# Patient Record
Sex: Female | Born: 1965 | Race: Black or African American | Hispanic: No | Marital: Single | Smoking: Never smoker
Health system: Southern US, Community
[De-identification: ages and names within clinical notes are randomized; demographics above are authoritative.]

## PROBLEM LIST (undated history)

## (undated) DIAGNOSIS — I10 Essential (primary) hypertension: Secondary | ICD-10-CM

## (undated) HISTORY — PX: LEG SURGERY: SHX1003

## (undated) HISTORY — PX: OTHER SURGICAL HISTORY: SHX169

---

## 1999-01-27 ENCOUNTER — Emergency Department (HOSPITAL_COMMUNITY): Admission: EM | Admit: 1999-01-27 | Discharge: 1999-01-27 | Payer: Self-pay | Admitting: Emergency Medicine

## 1999-01-27 ENCOUNTER — Encounter: Payer: Self-pay | Admitting: Emergency Medicine

## 2012-01-07 ENCOUNTER — Encounter (HOSPITAL_BASED_OUTPATIENT_CLINIC_OR_DEPARTMENT_OTHER): Payer: Self-pay

## 2012-01-07 ENCOUNTER — Telehealth: Payer: Self-pay | Admitting: Physician Assistant

## 2012-01-07 ENCOUNTER — Emergency Department (HOSPITAL_BASED_OUTPATIENT_CLINIC_OR_DEPARTMENT_OTHER): Payer: PRIVATE HEALTH INSURANCE

## 2012-01-07 ENCOUNTER — Emergency Department (HOSPITAL_BASED_OUTPATIENT_CLINIC_OR_DEPARTMENT_OTHER)
Admission: EM | Admit: 2012-01-07 | Discharge: 2012-01-07 | Disposition: A | Discharge status: Other Acute Inpatient Hospital | Payer: PRIVATE HEALTH INSURANCE | Attending: Emergency Medicine | Admitting: Emergency Medicine

## 2012-01-07 DIAGNOSIS — R209 Unspecified disturbances of skin sensation: Secondary | ICD-10-CM | POA: Insufficient documentation

## 2012-01-07 DIAGNOSIS — I169 Hypertensive crisis, unspecified: Secondary | ICD-10-CM

## 2012-01-07 DIAGNOSIS — Z79899 Other long term (current) drug therapy: Secondary | ICD-10-CM | POA: Insufficient documentation

## 2012-01-07 DIAGNOSIS — I619 Nontraumatic intracerebral hemorrhage, unspecified: Secondary | ICD-10-CM | POA: Insufficient documentation

## 2012-01-07 DIAGNOSIS — R51 Headache: Secondary | ICD-10-CM | POA: Insufficient documentation

## 2012-01-07 DIAGNOSIS — E119 Type 2 diabetes mellitus without complications: Secondary | ICD-10-CM | POA: Insufficient documentation

## 2012-01-07 DIAGNOSIS — I10 Essential (primary) hypertension: Secondary | ICD-10-CM | POA: Insufficient documentation

## 2012-01-07 HISTORY — DX: Essential (primary) hypertension: I10

## 2012-01-07 LAB — CBC WITH DIFFERENTIAL/PLATELET
Basophils Absolute: 0 10*3/uL (ref 0.0–0.1)
Eosinophils Relative: 3 % (ref 0–5)
HCT: 36.9 % (ref 36.0–46.0)
Lymphocytes Relative: 31 % (ref 12–46)
Lymphs Abs: 2.2 10*3/uL (ref 0.7–4.0)
MCV: 72.9 fL — ABNORMAL LOW (ref 78.0–100.0)
Monocytes Absolute: 0.4 10*3/uL (ref 0.1–1.0)
Neutro Abs: 4.3 10*3/uL (ref 1.7–7.7)
RBC: 5.06 MIL/uL (ref 3.87–5.11)
RDW: 16.4 % — ABNORMAL HIGH (ref 11.5–15.5)
WBC: 7.1 10*3/uL (ref 4.0–10.5)

## 2012-01-07 LAB — URINE MICROSCOPIC-ADD ON

## 2012-01-07 LAB — BASIC METABOLIC PANEL
CO2: 30 mEq/L (ref 19–32)
Calcium: 10.5 mg/dL (ref 8.4–10.5)
Chloride: 99 mEq/L (ref 96–112)
Glucose, Bld: 139 mg/dL — ABNORMAL HIGH (ref 70–99)
Sodium: 139 mEq/L (ref 135–145)

## 2012-01-07 LAB — URINALYSIS, ROUTINE W REFLEX MICROSCOPIC
Glucose, UA: NEGATIVE mg/dL
Leukocytes, UA: NEGATIVE
pH: 6 (ref 5.0–8.0)

## 2012-01-07 LAB — GLUCOSE, CAPILLARY: Glucose-Capillary: 158 mg/dL — ABNORMAL HIGH (ref 70–99)

## 2012-01-07 LAB — PREGNANCY, URINE: Preg Test, Ur: NEGATIVE

## 2012-01-07 MED ORDER — KETOROLAC TROMETHAMINE 30 MG/ML IJ SOLN
30.0000 mg | Freq: Once | INTRAMUSCULAR | Status: AC
  Administered 2012-01-07: 30 mg via INTRAVENOUS
  Filled 2012-01-07: qty 1

## 2012-01-07 MED ORDER — SODIUM CHLORIDE 0.9 % IV SOLN
INTRAVENOUS | Status: DC
  Administered 2012-01-07: 160 mL/h via INTRAVENOUS

## 2012-01-07 MED ORDER — METOCLOPRAMIDE HCL 5 MG/ML IJ SOLN
10.0000 mg | Freq: Once | INTRAMUSCULAR | Status: AC
  Administered 2012-01-07: 10 mg via INTRAVENOUS
  Filled 2012-01-07: qty 2

## 2012-01-07 MED ORDER — DIPHENHYDRAMINE HCL 50 MG/ML IJ SOLN
25.0000 mg | Freq: Once | INTRAMUSCULAR | Status: AC
  Administered 2012-01-07: 25 mg via INTRAVENOUS
  Filled 2012-01-07: qty 1

## 2012-01-07 MED ORDER — LABETALOL HCL 5 MG/ML IV SOLN
20.0000 mg | Freq: Once | INTRAVENOUS | Status: AC
Start: 2012-01-07 — End: 2012-01-07
  Administered 2012-01-07: 20 mg via INTRAVENOUS
  Filled 2012-01-07: qty 4

## 2012-01-07 NOTE — ED Notes (Signed)
Pt acuity changed from 3 to 2 based on plan of care.

## 2012-01-07 NOTE — Telephone Encounter (Signed)
[facility] Call.   I spoke with Dr. Ignacia Palma regarding Haley Riley.  Per his report she is stable.  HA since Sunday with BP 230/120 (now controlled).   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    I requested that Dr. Ignacia Palma speak with Neuro ICU / Neuro Surgery to ensure she has appropriate care before we accepted her onto our service.  Haley Downs, PA-C [facility] Pager: [phone]

## 2012-01-07 NOTE — ED Notes (Signed)
Pt informed of plan of care and admission. 

## 2012-01-07 NOTE — ED Notes (Signed)
Pt returned from CT °

## 2012-01-07 NOTE — ED Notes (Signed)
Bed assignment received.  Awaiting [facility] for transport.  Pt informed of plan of care.

## 2012-01-07 NOTE — ED Notes (Signed)
CBG-158mg /dcL.  MD and RN aware.

## 2012-01-07 NOTE — ED Notes (Signed)
MD at bedside. 

## 2012-01-07 NOTE — ED Notes (Signed)
Patient transported to CT via stretcher,

## 2012-01-07 NOTE — ED Notes (Signed)
Pt reports onset of headache Sunday unrelieved after taking BP medication, Ibuprofen and Goody Powder.

## 2012-01-07 NOTE — ED Provider Notes (Signed)
History     CSN: [id number]  Arrival date & time 01/07/12  0902   First MD Initiated Contact with Patient 01/07/12 [phone]      Chief Complaint  Patient presents with  . Headache    (Consider location/radiation/quality/duration/timing/severity/associated sxs/prior treatment) HPI Comments: The patient is a 46 year old woman who complains of a 3 day history of headache. She says this started on Sunday, 3 days ago, when she was at church. She felt a headache all around her head from her temples to the back of the head. It felt like someone was beating her on the head. She has tried some blood pressure medication she had left over from some years ago, as well as taking ibuprofen, with transient relief. She says that she had some numbness in the right side of her body on Sunday but that has resolved. For her headache persisted, she sought evaluation. 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 She's had multiple operations for old fractures. In the past she had been on hydrochlorothiazide, amlodipine, and metformin. She has no current physician.  Patient is a 46 y.o. female presenting with headaches. The history is provided by the patient. No language interpreter was used.  Headache  This is a new problem. The current episode started more than 2 days ago. The problem occurs constantly. The problem has not changed since onset.The headache is associated with nothing. The quality of the pain is described as throbbing (Like someone hitting her head.). The pain is severe. The pain does not radiate. Associated symptoms comments: Transient right sided numbness 3 days ago.. Treatments tried: She took her old medicine for hypertension, and also took Ibuprofen, with transient relief.     Past Medical History  Diagnosis Date  . Diabetes mellitus   . Hypertension     Past Surgical History  Procedure Date  . Arm surgery   . Leg surgery     No family  history on file.  History  Substance Use Topics  . Smoking status: Never Smoker   . Smokeless tobacco: Never Used  . Alcohol Use: No    OB History    Grav Para Term Preterm Abortions TAB SAB Ect Mult Living                  Review of Systems  Constitutional: Negative.   Eyes: Negative.   Respiratory: Negative.   Cardiovascular: Negative.   Gastrointestinal: Negative.   Genitourinary: Negative.   Musculoskeletal: Negative.   Skin: Negative.   Neurological: Positive for numbness and headaches.  Psychiatric/Behavioral: Negative.     Allergies  Review of patient's allergies indicates no known allergies.  Home Medications   Current Outpatient Rx  Name Route Sig Dispense Refill  . AMLODIPINE BESYLATE 5 MG PO TABS Oral Take 5 mg by mouth daily.    . ATORVASTATIN CALCIUM 10 MG PO TABS Oral Take 10 mg by mouth daily.    Marland Kitchen HYDROCHLOROTHIAZIDE 25 MG PO TABS Oral Take 25 mg by mouth daily.    Marland Kitchen POLYSACCHARIDE IRON COMPLEX 150 MG PO CAPS Oral Take 150 mg by mouth daily.    Marland Kitchen METFORMIN HCL 500 MG PO TABS Oral Take 500 mg by mouth 2 (two) times daily with a meal.      BP 125/72  Pulse 74  Temp 98.9 F (37.2 C) (Oral)  Resp 18  Ht 5' (1.524 m)  Wt 220 lb (99.791 kg)  BMI 42.97 kg/m2  SpO2  100%  LMP 12/06/2011  Physical Exam  Nursing note and vitals reviewed. Constitutional:       BP markedly elevated at 231/122.  HENT:  Head: Normocephalic and atraumatic.  Right Ear: External ear normal.  Left Ear: External ear normal.  Mouth/Throat: Oropharynx is clear and moist.  Eyes: Conjunctivae and EOM are normal. Pupils are equal, round, and reactive to light.  Neck: Normal range of motion. Neck supple.  Cardiovascular: Normal rate, regular rhythm and normal heart sounds.   Pulmonary/Chest: Effort normal and breath sounds normal.  Abdominal: Soft. Bowel sounds are normal.  Musculoskeletal: Normal range of motion. She exhibits no edema and no tenderness.       She has  multiple scars on her forearms, hands, and feet from old fractures.  Skin: Skin is warm and dry.  Psychiatric: She has a normal mood and affect. Her behavior is normal.    ED Course  CRITICAL CARE Performed by: Osvaldo Human Authorized by: Osvaldo Human Total critical care time: 30 minutes Critical care was necessary to treat or prevent imminent or life-threatening deterioration of the following conditions: Hypertensive crisis, workup revealing intracerebral hemorrhage, treated with IV Labetalol, telephone consult with intensivist to arrange transfer to ICU at [facility]. Critical care was time spent personally by me on the following activities: discussions with consultants, evaluation of patient's response to treatment, examination of patient, obtaining history from patient or surrogate, ordering and performing treatments and interventions, ordering and review of laboratory studies, ordering and review of radiographic studies and re-evaluation of patient's condition.   (including critical care time)  Labs Reviewed  GLUCOSE, CAPILLARY - Abnormal; Notable for the following:    Glucose-Capillary 158 (*)     All other components within normal limits  CBC WITH DIFFERENTIAL - Abnormal; Notable for the following:    Hemoglobin 11.9 (*)     MCV 72.9 (*)     MCH 23.5 (*)     RDW 16.4 (*)     All other components within normal limits  URINALYSIS, ROUTINE W REFLEX MICROSCOPIC - Abnormal; Notable for the following:    Hgb urine dipstick TRACE (*)     All other components within normal limits  PREGNANCY, URINE  BASIC METABOLIC PANEL  URINE MICROSCOPIC-ADD ON   10:36 AM  Date: 01/07/2012  Rate: 85  Rhythm: normal sinus rhythm, sinus arrhythmia and premature ventricular contractions (PVC)  QRS Axis: left  Intervals: normal QRS:  Left ventricular hypertrophy; poor R wave progression in the precordial leads suggests possible old anterior myocardial infarction.  ST/T Wave abnormalities:  normal  Conduction Disutrbances:none  Narrative Interpretation: Abnormal EKG  Old EKG Reviewed: changes noted--Poor R wave progresson in precordial leads is new since tracing of8/27/2000.  Results for orders placed during the hospital encounter of 01/07/12  GLUCOSE, CAPILLARY      Component Value Range   Glucose-Capillary 158 (*) 70 - 99 mg/dL  CBC WITH DIFFERENTIAL      Component Value Range   WBC 7.1  4.0 - 10.5 K/uL   RBC 5.06  3.87 - 5.11 MIL/uL   Hemoglobin 11.9 (*) 12.0 - 15.0 g/dL   HCT 91.4  78.2 - 95.6 %   MCV 72.9 (*) 78.0 - 100.0 fL   MCH 23.5 (*) 26.0 - 34.0 pg   MCHC 32.2  30.0 - 36.0 g/dL   RDW 21.3 (*) 08.6 - 57.8 %   Platelets 298  150 - 400 K/uL   Neutrophils Relative 60  43 -  77 %   Neutro Abs 4.3  1.7 - 7.7 K/uL   Lymphocytes Relative 31  12 - 46 %   Lymphs Abs 2.2  0.7 - 4.0 K/uL   Monocytes Relative 6  3 - 12 %   Monocytes Absolute 0.4  0.1 - 1.0 K/uL   Eosinophils Relative 3  0 - 5 %   Eosinophils Absolute 0.2  0.0 - 0.7 K/uL   Basophils Relative 0  0 - 1 %   Basophils Absolute 0.0  0.0 - 0.1 K/uL  BASIC METABOLIC PANEL      Component Value Range   Sodium 139  135 - 145 mEq/L   Potassium 3.2 (*) 3.5 - 5.1 mEq/L   Chloride 99  96 - 112 mEq/L   CO2 30  19 - 32 mEq/L   Glucose, Bld 139 (*) 70 - 99 mg/dL   BUN 12  6 - 23 mg/dL   Creatinine, Ser 1.61  0.50 - 1.10 mg/dL   Calcium 09.6  8.4 - 04.5 mg/dL   GFR calc non Af Amer 76 (*) >90 mL/min   GFR calc Af Amer 88 (*) >90 mL/min  URINALYSIS, ROUTINE W REFLEX MICROSCOPIC      Component Value Range   Color, Urine YELLOW  YELLOW   APPearance CLEAR  CLEAR   Specific Gravity, Urine 1.027  1.005 - 1.030   pH 6.0  5.0 - 8.0   Glucose, UA NEGATIVE  NEGATIVE mg/dL   Hgb urine dipstick TRACE (*) NEGATIVE   Bilirubin Urine NEGATIVE  NEGATIVE   Ketones, ur NEGATIVE  NEGATIVE mg/dL   Protein, ur NEGATIVE  NEGATIVE mg/dL   Urobilinogen, UA 1.0  0.0 - 1.0 mg/dL   Nitrite NEGATIVE  NEGATIVE   Leukocytes, UA  NEGATIVE  NEGATIVE  PREGNANCY, URINE      Component Value Range   Preg Test, Ur NEGATIVE  NEGATIVE  URINE MICROSCOPIC-ADD ON      Component Value Range   Squamous Epithelial / LPF FEW (*) RARE   WBC, UA 0-2  <3 WBC/hpf   RBC / HPF 0-2  <3 RBC/hpf   Bacteria, UA MANY (*) RARE   Ct Head Wo Contrast  01/07/2012  *RADIOLOGY REPORT*  Clinical Data: Severe headache.  Elevated blood pressure.  CT HEAD WITHOUT CONTRAST  Technique:  Contiguous axial images were obtained from the base of the skull through the vertex without contrast.  Comparison: None.  Findings: There is a transverse area of hemorrhage within the body of the corpus callosum.  Blood extends into the right lateral ventricle.  No mass effect or midline shift.  No hydrocephalus at this time.  Calcifications are noted in the vessels at the skull base.  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 No acute ischemic infarct.  No acute calvarial abnormality.  IMPRESSION: Transverse acute hemorrhage within the body of the corpus callosum with extension into the right lateral ventricle.  This is presumably a small vessel bleed related hypertension.  I cannot completely exclude a vascular malformation or other cause of hemorrhage.  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  Critical Value/emergent results were called by telephone at the time of interpretation on 01/07/2012 at 10:53 a.m. to Dr. Ignacia Palma, who verbally acknowledged these results.  Original Report Authenticated By: Cyndie Chime, M.D.    Pt seen --> physical exam performed.  Lab workup and CT of brain ordered.  IV Labetalol for HTN, IV reglan, toradol  and Benadryl for headache, with BP down to 125 systolic and headache improved.  Her CT of the brain showed an intracerebral hemorrhage with bleeding into the right ventricle.  Call to [facility] to admit her for hypertensive crisis, intracerebral hemorrhage.  11:55 AM Now pt wants to go to [facility].  Will call hospitalist there.  12:23 PM Pt refused by hospitalist, referred to intensivist.  Spoke to Dr. Ellyn Hack, who accepts pt to the ICU at [facility].   1. Hypertensive crisis   2. Intracerebral hemorrhage       Carleene Cooper III, MD 01/07/12 1228

## 2013-01-16 IMAGING — CT CT HEAD W/O CM
1 series · 15 of 30 positions shown, 19 images · non-contrast
Comparison: None.

CLINICAL DATA: Severe headache.  Elevated blood pressure.

CT HEAD WITHOUT CONTRAST
TECHNIQUE: Contiguous axial images were obtained from the base of
the skull through the vertex without contrast.

[Series 2: head 4.8 h37s · axial · 0.45mm/px · z∈[+1305,+1445]mm · 15 of 32 slices shown, 19 images]
[im 2/32  brain]
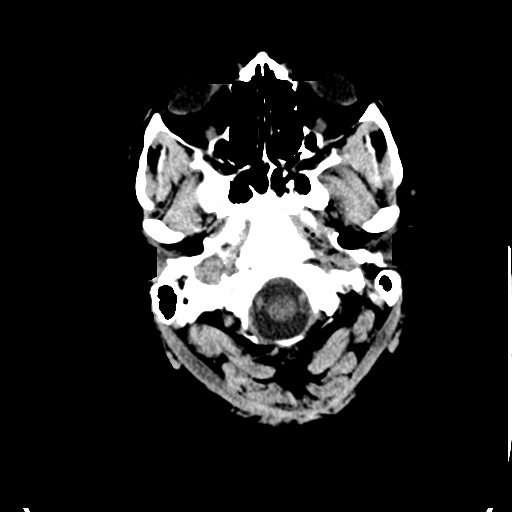
[im 2/32  bone]
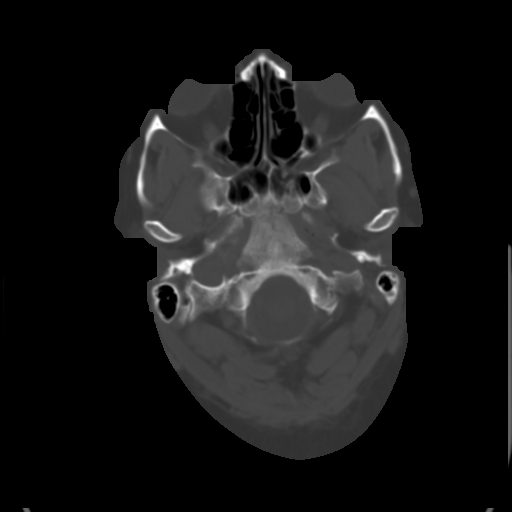
[im 4/32  brain]
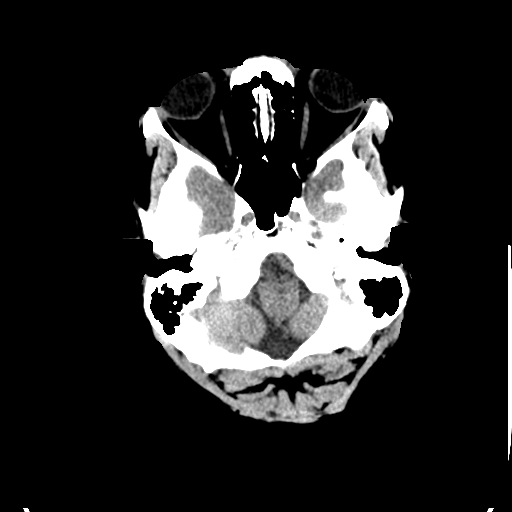
[im 6/32  brain]
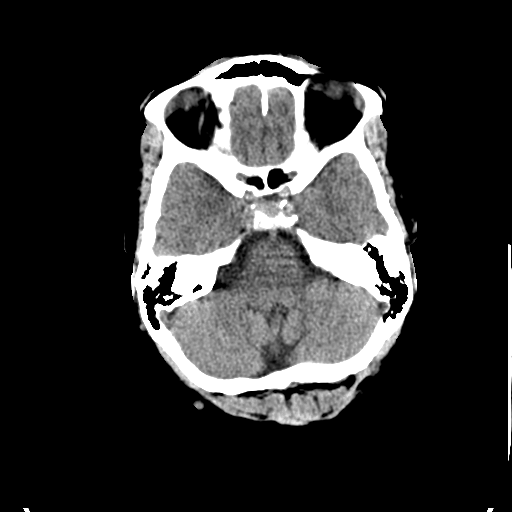
[im 8/32  brain]
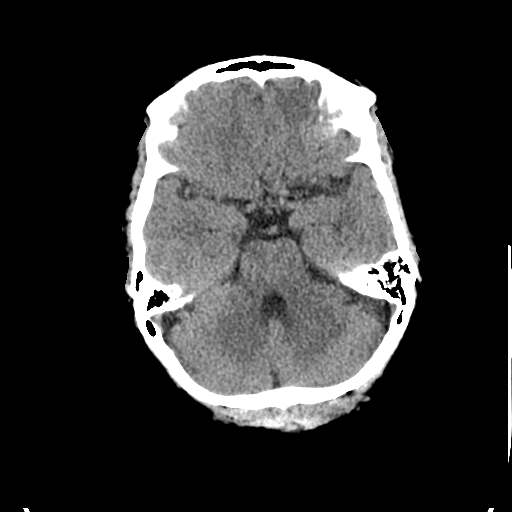
[im 10/32  brain]
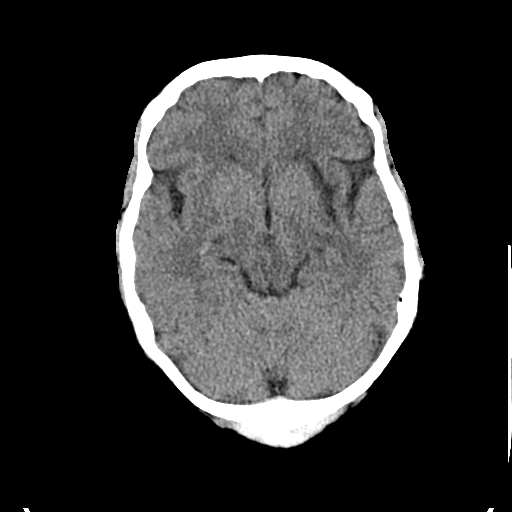
[im 10/32  bone]
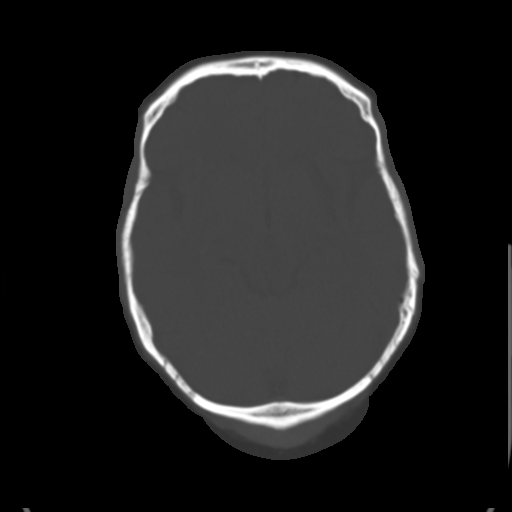
[im 12/32  brain]
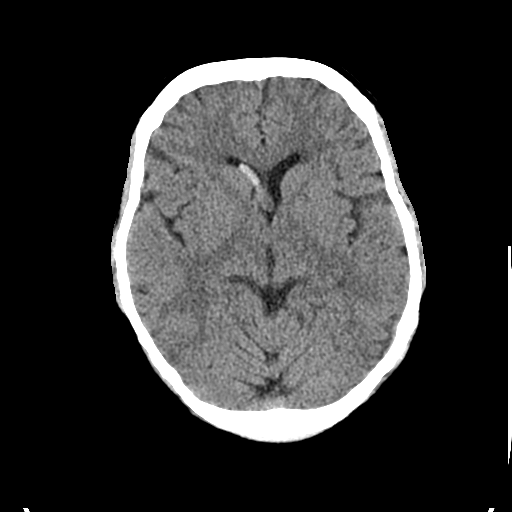
[im 14/32  brain]
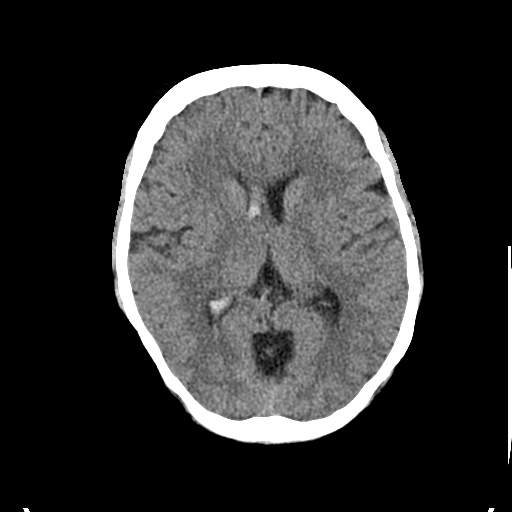
[im 17/32  brain]
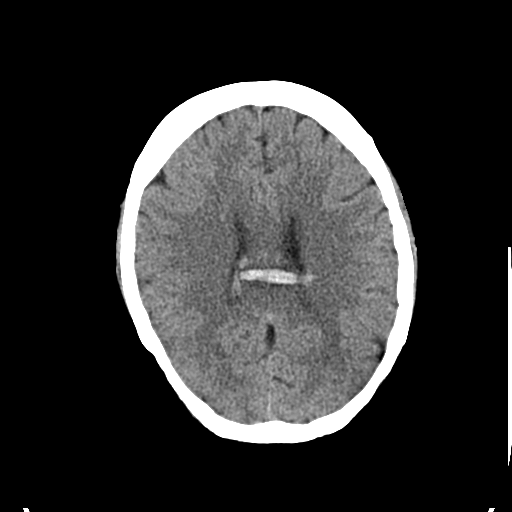
[im 18/32  brain]
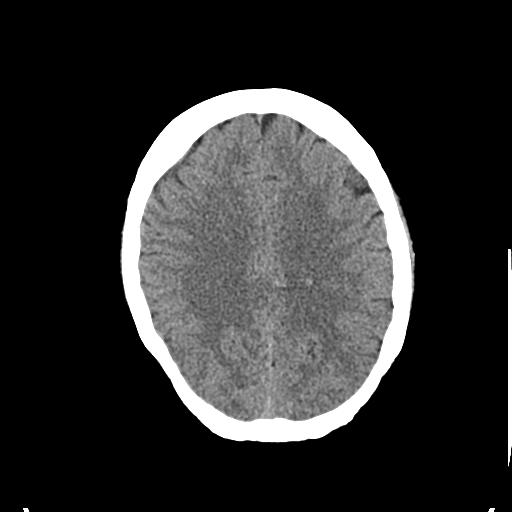
[im 18/32  bone]
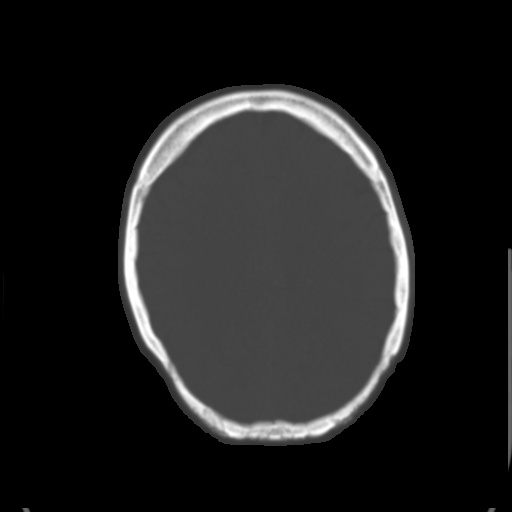
[im 20/32  brain]
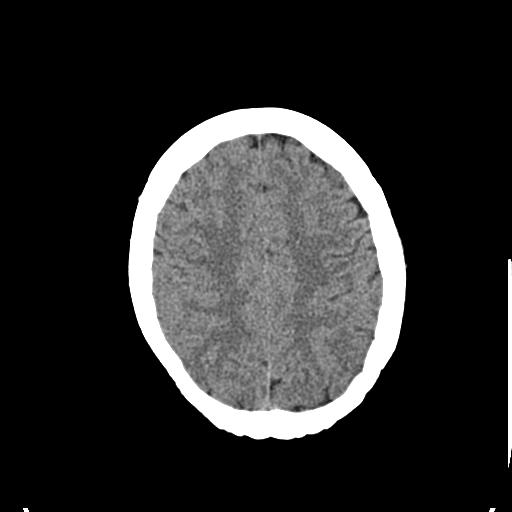
[im 22/32  brain]
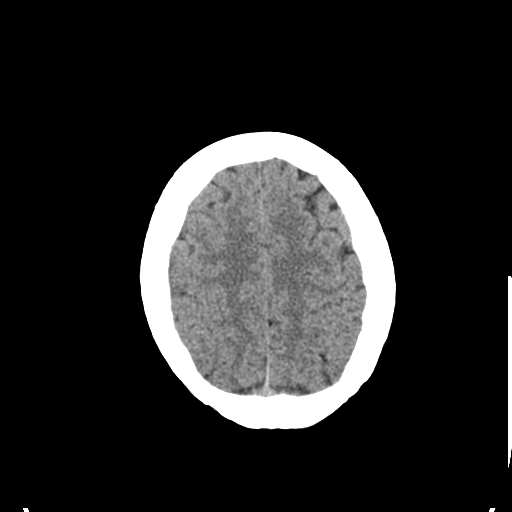
[im 24/32  brain]
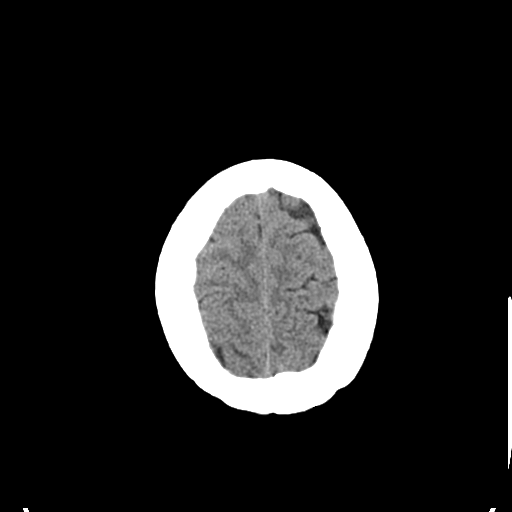
[im 26/32  brain]
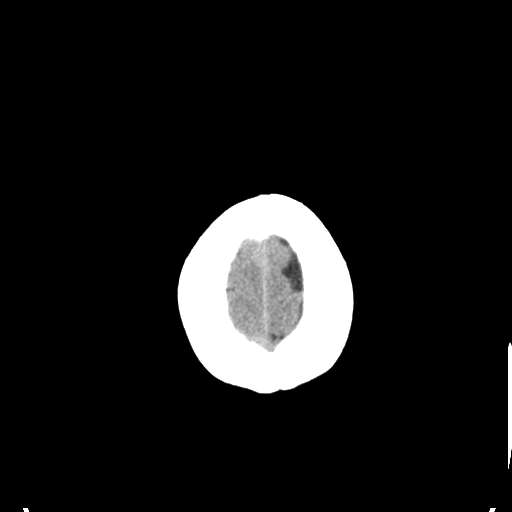
[im 26/32  bone]
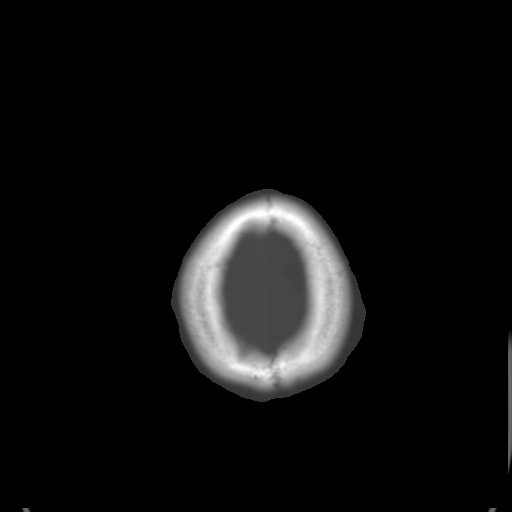
[im 28/32  brain]
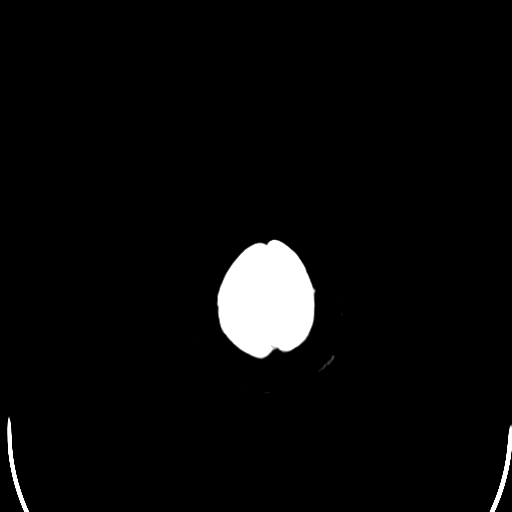
[im 30/32  brain]
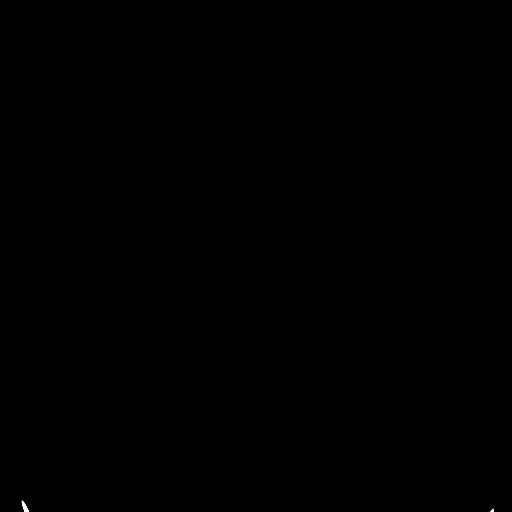

[15 of 30 positions shown; findings below may reference images not displayed]

FINDINGS: There is a transverse area of hemorrhage within the body
of the corpus callosum.  Blood extends into the right lateral
ventricle.  No mass effect or midline shift.  No hydrocephalus at
this time.

Calcifications are noted in the vessels at the skull base.  There
is evidence of old lacunar infarct in the left external capsule.
No acute ischemic infarct.  No acute calvarial abnormality.
IMPRESSION: Transverse acute hemorrhage within the body of the corpus callosum
with extension into the right lateral ventricle.  This is
presumably a small vessel bleed related hypertension.  I cannot
completely exclude a vascular malformation or other cause of
hemorrhage.

Small old lacunar infarct in the left external capsule.

Critical Value/emergent results were called by telephone at the
time of interpretation on 01/07/2012 at [DATE] a.m. to Dr. Aqua,
who verbally acknowledged these results.

## 2013-07-29 ENCOUNTER — Encounter (HOSPITAL_BASED_OUTPATIENT_CLINIC_OR_DEPARTMENT_OTHER): Payer: Self-pay | Admitting: Emergency Medicine

## 2013-07-29 ENCOUNTER — Emergency Department (HOSPITAL_BASED_OUTPATIENT_CLINIC_OR_DEPARTMENT_OTHER)
Admission: EM | Admit: 2013-07-29 | Discharge: 2013-07-29 | Disposition: A | Discharge status: Home / Self Care | Payer: PRIVATE HEALTH INSURANCE | Attending: Emergency Medicine | Admitting: Emergency Medicine

## 2013-07-29 DIAGNOSIS — E1110 Type 2 diabetes mellitus with ketoacidosis without coma: Secondary | ICD-10-CM | POA: Insufficient documentation

## 2013-07-29 DIAGNOSIS — R632 Polyphagia: Secondary | ICD-10-CM | POA: Insufficient documentation

## 2013-07-29 DIAGNOSIS — R5383 Other fatigue: Secondary | ICD-10-CM

## 2013-07-29 DIAGNOSIS — R631 Polydipsia: Secondary | ICD-10-CM | POA: Insufficient documentation

## 2013-07-29 DIAGNOSIS — R51 Headache: Secondary | ICD-10-CM | POA: Insufficient documentation

## 2013-07-29 DIAGNOSIS — I10 Essential (primary) hypertension: Secondary | ICD-10-CM | POA: Insufficient documentation

## 2013-07-29 DIAGNOSIS — R3589 Other polyuria: Secondary | ICD-10-CM | POA: Insufficient documentation

## 2013-07-29 DIAGNOSIS — J3489 Other specified disorders of nose and nasal sinuses: Secondary | ICD-10-CM | POA: Insufficient documentation

## 2013-07-29 DIAGNOSIS — Z3202 Encounter for pregnancy test, result negative: Secondary | ICD-10-CM | POA: Insufficient documentation

## 2013-07-29 DIAGNOSIS — R358 Other polyuria: Secondary | ICD-10-CM | POA: Insufficient documentation

## 2013-07-29 DIAGNOSIS — Z794 Long term (current) use of insulin: Secondary | ICD-10-CM | POA: Insufficient documentation

## 2013-07-29 DIAGNOSIS — R5381 Other malaise: Secondary | ICD-10-CM | POA: Insufficient documentation

## 2013-07-29 DIAGNOSIS — Z79899 Other long term (current) drug therapy: Secondary | ICD-10-CM | POA: Insufficient documentation

## 2013-07-29 LAB — URINALYSIS, ROUTINE W REFLEX MICROSCOPIC
Bilirubin Urine: NEGATIVE
Ketones, ur: >80 mg/dL — AB
LEUKOCYTES UA: NEGATIVE
Nitrite: NEGATIVE
PH: 5 (ref 5.0–8.0)
Protein, ur: NEGATIVE mg/dL
SPECIFIC GRAVITY, URINE: 1.028 (ref 1.005–1.030)
Urobilinogen, UA: 0.2 mg/dL (ref 0.0–1.0)

## 2013-07-29 LAB — BASIC METABOLIC PANEL
BUN: 16 mg/dL (ref 6–23)
CALCIUM: 10.6 mg/dL — AB (ref 8.4–10.5)
CO2: 21 meq/L (ref 19–32)
CREATININE: 0.8 mg/dL (ref 0.50–1.10)
Chloride: 89 mEq/L — ABNORMAL LOW (ref 96–112)
GFR calc Af Amer: >90 mL/min (ref >90)
GFR calc non Af Amer: 86 mL/min — ABNORMAL LOW (ref >90)
Glucose, Bld: 570 mg/dL (ref 70–99)
Potassium: 4.4 mEq/L (ref 3.7–5.3)
Sodium: 133 mEq/L — ABNORMAL LOW (ref 137–147)

## 2013-07-29 LAB — URINE MICROSCOPIC-ADD ON

## 2013-07-29 LAB — CBC WITH DIFFERENTIAL/PLATELET
BASOS ABS: 0 10*3/uL (ref 0.0–0.1)
BASOS PCT: 0 % (ref 0–1)
EOS PCT: 1 % (ref 0–5)
Eosinophils Absolute: 0.1 10*3/uL (ref 0.0–0.7)
HEMATOCRIT: 38.8 % (ref 36.0–46.0)
HEMOGLOBIN: 12.8 g/dL (ref 12.0–15.0)
LYMPHS PCT: 37 % (ref 12–46)
Lymphs Abs: 2.8 10*3/uL (ref 0.7–4.0)
MCH: 26.9 pg (ref 26.0–34.0)
MCHC: 33 g/dL (ref 30.0–36.0)
MCV: 81.7 fL (ref 78.0–100.0)
MONO ABS: 0.6 10*3/uL (ref 0.1–1.0)
MONOS PCT: 8 % (ref 3–12)
Neutro Abs: 4 10*3/uL (ref 1.7–7.7)
Neutrophils Relative %: 53 % (ref 43–77)
Platelets: 263 10*3/uL (ref 150–400)
RBC: 4.75 MIL/uL (ref 3.87–5.11)
RDW: 13.4 % (ref 11.5–15.5)
WBC: 7.4 10*3/uL (ref 4.0–10.5)

## 2013-07-29 LAB — CBG MONITORING, ED
GLUCOSE-CAPILLARY: 361 mg/dL — AB (ref 70–99)
Glucose-Capillary: 530 mg/dL — ABNORMAL HIGH (ref 70–99)

## 2013-07-29 LAB — I-STAT CG4 LACTIC ACID, ED: LACTIC ACID, VENOUS: 2.33 mmol/L — AB (ref 0.5–2.2)

## 2013-07-29 LAB — PREGNANCY, URINE: PREG TEST UR: NEGATIVE

## 2013-07-29 MED ORDER — SODIUM CHLORIDE 0.9 % IV SOLN
Freq: Once | INTRAVENOUS | Status: AC
  Administered 2013-07-29: 17:00:00 via INTRAVENOUS

## 2013-07-29 MED ORDER — SODIUM CHLORIDE 0.9 % IV SOLN
Freq: Once | INTRAVENOUS | Status: AC
  Administered 2013-07-29: 18:00:00 via INTRAVENOUS

## 2013-07-29 MED ORDER — SODIUM CHLORIDE 0.9 % IV SOLN
INTRAVENOUS | Status: DC
  Administered 2013-07-29: 5.1 [IU]/h via INTRAVENOUS

## 2013-07-29 MED ORDER — SODIUM CHLORIDE 0.9 % IV SOLN
Freq: Once | INTRAVENOUS | Status: AC
  Administered 2013-07-29: 20:00:00 via INTRAVENOUS

## 2013-07-29 MED ORDER — INSULIN REGULAR HUMAN 100 UNIT/ML IJ SOLN
INTRAMUSCULAR | Status: AC
  Filled 2013-07-29: qty 1

## 2013-07-29 NOTE — ED Notes (Signed)
[facility] this [facility] for headache. States she has had headaches since starting on Levemir. She did not take her dose yesterday or today. Her sugar in the office was over 600. She was told to come here for further evaluation.

## 2013-07-29 NOTE — ED Provider Notes (Signed)
CSN: [id number]     Arrival date & time 07/29/13  1621 History   First MD Initiated Contact with Patient 07/29/13 1629     Chief Complaint  Patient presents with  . Headache  . Hyperglycemia     (Consider location/radiation/quality/duration/timing/severity/associated sxs/prior Treatment) HPI Comments: Patient here with hyperglycemia and headache - she was [facility] at [facility] and noted to be quite hyperglycemic.  She states that about 1 week ago she was seen at the clinic and transitioned from metformin to the Levemir 35 units at bedtime.  She reports that since starting this she has felt worse with a headache to the left side of her face and forehead, denies fever, chills, nausea, vomiting, states that last night she felt so bad that she did not take the Levemir but awoke this morning and took some of her metformin that she had left.  She states that her sugars have been running high lately anyway.  She states that she has lost about 20 pounds recently, she endorses polyphagia, polyuria, polydipsia.  She denies chest pain, shortness of breath, abdominal pain, vomiting, nausea, diarrhea.  Patient is a 48 y.o. female presenting with headaches and hyperglycemia. The history is provided by the patient. No language interpreter was used.  Headache Pain location:  Frontal Quality:  Dull Severity currently:  6/10 Severity at highest:  6/10 Onset quality:  Gradual Duration:  3 days Timing:  Constant Progression:  Worsening Chronicity:  New Similar to prior headaches: no   Context: not caffeine, not coughing, not eating, not stress, not intercourse, not loud noise and not straining   Relieved by:  Nothing Worsened by:  Nothing tried Ineffective treatments:  None tried Associated symptoms: facial pain, fatigue and sinus pressure   Associated symptoms: no abdominal pain, no blurred vision, no congestion, no diarrhea, no dizziness, no drainage, no ear pain, no fever, no focal  weakness, no myalgias, no nausea, no neck pain, no neck stiffness, no numbness, no photophobia, no swollen glands, no syncope, no URI, no visual change and no vomiting   Hyperglycemia Associated symptoms: fatigue   Associated symptoms: no abdominal pain, no blurred vision, no dizziness, no fever, no nausea, no syncope and no vomiting     Past Medical History  Diagnosis Date  . Diabetes mellitus   . Hypertension    Past Surgical History  Procedure Laterality Date  . Arm surgery    . Leg surgery     No family history on file. History  Substance Use Topics  . Smoking status: Never Smoker   . Smokeless tobacco: Never Used  . Alcohol Use: No   OB History   Grav Para Term Preterm Abortions TAB SAB Ect Mult Living                 Review of Systems  Constitutional: Positive for fatigue. Negative for fever.  HENT: Positive for sinus pressure. Negative for congestion, ear pain and postnasal drip.   Eyes: Negative for blurred vision and photophobia.  Cardiovascular: Negative for syncope.  Gastrointestinal: Negative for nausea, vomiting, abdominal pain and diarrhea.  Musculoskeletal: Negative for myalgias, neck pain and neck stiffness.  Neurological: Positive for headaches. Negative for dizziness, focal weakness and numbness.  All other systems reviewed and are negative.      Allergies  Review of patient's allergies indicates no known allergies.  Home Medications   Current Outpatient Rx  Name  Route  Sig  Dispense  Refill  .  alendronate (FOSAMAX) 70 MG tablet   Oral   Take 70 mg by mouth once a week. Take with a full glass of water on an empty stomach.         Marland Kitchen amLODipine (NORVASC) 10 MG tablet   Oral   Take 10 mg by mouth daily.         Marland Kitchen atorvastatin (LIPITOR) 40 MG tablet   Oral   Take 40 mg by mouth daily.         . carvedilol (COREG) 6.25 MG tablet   Oral   Take 6.25 mg by mouth 2 (two) times daily with a meal.         . cholecalciferol (VITAMIN D)  1000 UNITS tablet   Oral   Take 1,000 Units by mouth daily.         Marland Kitchen glipiZIDE (GLUCOTROL) 5 MG tablet   Oral   Take by mouth daily before breakfast.         . insulin detemir (LEVEMIR) 100 UNIT/ML injection   Subcutaneous   Inject 35 Units into the skin at bedtime.         . Insulin Pen Needle (NOVOFINE) 32G X 6 MM MISC   Does not apply   by Does not apply route.         Marland Kitchen lisinopril (PRINIVIL,ZESTRIL) 40 MG tablet   Oral   Take 40 mg by mouth daily.         Marland Kitchen ibuprofen (ADVIL,MOTRIN) 200 MG tablet   Oral   Take 400 mg by mouth daily as needed. headache         . metFORMIN (GLUCOPHAGE) 500 MG tablet   Oral   Take 500 mg by mouth 2 (two) times daily with a meal.          BP 149/95  Pulse 106  Temp(Src) 99.4 F (37.4 C) (Oral)  Resp 20  Ht 5\' 1"  (1.549 m)  Wt 178 lb (80.74 kg)  BMI 33.65 kg/m2  SpO2 100% Physical Exam  Nursing note and vitals reviewed. Constitutional: She is oriented to person, place, and time. She appears well-developed and well-nourished. No distress.  HENT:  Head: Normocephalic and atraumatic.  Right Ear: External ear normal.  Left Ear: External ear normal.  Nose: Nose normal.  Mouth/Throat: Oropharynx is clear and moist. No oropharyngeal exudate.  Eyes: Conjunctivae are normal. Pupils are equal, round, and reactive to light. No scleral icterus.  Neck: Normal range of motion. Neck supple.  Cardiovascular: Normal rate, regular rhythm and normal heart sounds.  Exam reveals no gallop and no friction rub.   No murmur heard. Pulmonary/Chest: Effort normal and breath sounds normal. No respiratory distress. She has no wheezes. She has no rales. She exhibits no tenderness.  Abdominal: Soft. Bowel sounds are normal. She exhibits no distension. There is no tenderness. There is no rebound and no guarding.  Musculoskeletal: Normal range of motion. She exhibits no edema and no tenderness.  Lymphadenopathy:    She has no cervical adenopathy.   Neurological: She is alert and oriented to person, place, and time. She exhibits normal muscle tone. Coordination normal.  Skin: Skin is warm and dry. No rash noted. No erythema. No pallor.  Psychiatric: She has a normal mood and affect. Her behavior is normal. Judgment and thought content normal.    ED Course  Procedures (including critical care time) Labs Review Labs Reviewed  URINALYSIS, ROUTINE W REFLEX MICROSCOPIC - Abnormal; Notable for the following:  Glucose, UA >1000 (*)    Hgb urine dipstick TRACE (*)    Ketones, ur >80 (*)    All other components within normal limits  BASIC METABOLIC PANEL - Abnormal; Notable for the following:    Sodium 133 (*)    Chloride 89 (*)    Glucose, Bld 570 (*)    Calcium 10.6 (*)    GFR calc non Af Amer 86 (*)    All other components within normal limits  CBG MONITORING, ED - Abnormal; Notable for the following:    Glucose-Capillary 530 (*)    All other components within normal limits  I-STAT CG4 LACTIC ACID, ED - Abnormal; Notable for the following:    Lactic Acid, Venous 2.33 (*)    All other components within normal limits  PREGNANCY, URINE  URINE MICROSCOPIC-ADD ON  CBC WITH DIFFERENTIAL   Imaging Review No results found.  EKG Interpretation  None Results for orders placed during the hospital encounter of 07/29/13  URINALYSIS, ROUTINE W REFLEX MICROSCOPIC      Result Value Ref Range   Color, Urine YELLOW  YELLOW   APPearance CLEAR  CLEAR   Specific Gravity, Urine 1.028  1.005 - 1.030   pH 5.0  5.0 - 8.0   Glucose, UA >1000 (*) NEGATIVE mg/dL   Hgb urine dipstick TRACE (*) NEGATIVE   Bilirubin Urine NEGATIVE  NEGATIVE   Ketones, ur >80 (*) NEGATIVE mg/dL   Protein, ur NEGATIVE  NEGATIVE mg/dL   Urobilinogen, UA 0.2  0.0 - 1.0 mg/dL   Nitrite NEGATIVE  NEGATIVE   Leukocytes, UA NEGATIVE  NEGATIVE  PREGNANCY, URINE      Result Value Ref Range   Preg Test, Ur NEGATIVE  NEGATIVE  URINE MICROSCOPIC-ADD ON      Result  Value Ref Range   Squamous Epithelial / LPF RARE  RARE   WBC, UA 0-2  <3 WBC/hpf   RBC / HPF 0-2  <3 RBC/hpf   Bacteria, UA RARE  RARE   Urine-Other FEW YEAST    CBC WITH DIFFERENTIAL      Result Value Ref Range   WBC 7.4  4.0 - 10.5 K/uL   RBC 4.75  3.87 - 5.11 MIL/uL   Hemoglobin 12.8  12.0 - 15.0 g/dL   HCT 16.1  09.6 - 04.5 %   MCV 81.7  78.0 - 100.0 fL   MCH 26.9  26.0 - 34.0 pg   MCHC 33.0  30.0 - 36.0 g/dL   RDW 40.9  81.1 - 91.4 %   Platelets 263  150 - 400 K/uL   Neutrophils Relative % 53  43 - 77 %   Neutro Abs 4.0  1.7 - 7.7 K/uL   Lymphocytes Relative 37  12 - 46 %   Lymphs Abs 2.8  0.7 - 4.0 K/uL   Monocytes Relative 8  3 - 12 %   Monocytes Absolute 0.6  0.1 - 1.0 K/uL   Eosinophils Relative 1  0 - 5 %   Eosinophils Absolute 0.1  0.0 - 0.7 K/uL   Basophils Relative 0  0 - 1 %   Basophils Absolute 0.0  0.0 - 0.1 K/uL  BASIC METABOLIC PANEL      Result Value Ref Range   Sodium 133 (*) 137 - 147 mEq/L   Potassium 4.4  3.7 - 5.3 mEq/L   Chloride 89 (*) 96 - 112 mEq/L   CO2 21  19 - 32 mEq/L   Glucose, Bld 570 (*)  70 - 99 mg/dL   BUN 16  6 - 23 mg/dL   Creatinine, Ser 1.61  0.50 - 1.10 mg/dL   Calcium 09.6 (*) 8.4 - 10.5 mg/dL   GFR calc non Af Amer 86 (*) >90 mL/min   GFR calc Af Amer >90  >90 mL/min  CBG MONITORING, ED      Result Value Ref Range   Glucose-Capillary 530 (*) 70 - 99 mg/dL  I-STAT CG4 LACTIC ACID, ED      Result Value Ref Range   Lactic Acid, Venous 2.33 (*) 0.5 - 2.2 mmol/L   No results found.   MDM   DKA  Patient is 48 year old diabetic with poorly controlled DM and lack of understanding about the disease process who presents from [facility] after stopping her insulin and taking partial dose of her metformin.  She is noted to be acidotic here, started on insulin drip.  Gap here 23, but patient not acutely ill and no respiratory compensation at this time.  I have spoken with Dr. Minda Meo with [facility] hospitalists who will  admit the patient there.   Izola Price Marisue Humble, [place] 07/29/13 1919

## 2013-07-29 NOTE — ED Notes (Signed)
Reported glucose to [facility]  570

## 2013-07-29 NOTE — ED Notes (Signed)
Report given to Dione,RN

## 2013-07-29 NOTE — ED Notes (Signed)
Blood sugar obtained at 361

## 2013-07-29 NOTE — ED Notes (Signed)
[facility] to [facility] RN with [facility]

## 2013-07-31 NOTE — ED Provider Notes (Signed)
Medical screening examination/treatment/procedure(s) were performed by non-physician practitioner and as supervising physician I was immediately available for consultation/collaboration.   EKG Interpretation None       Omar Orrego R. Cortlyn Cannell, MD 07/31/13 1455 

## 2014-04-02 DEATH — deceased
